# Patient Record
Sex: Male | Born: 1955 | Race: White | Hispanic: No | Marital: Married | Smoking: Never smoker
Health system: Southern US, Community
[De-identification: ages and names within clinical notes are randomized; demographics above are authoritative.]

## PROBLEM LIST (undated history)

## (undated) DIAGNOSIS — F419 Anxiety disorder, unspecified: Secondary | ICD-10-CM

## (undated) HISTORY — PX: HERNIA REPAIR: SHX51

## (undated) HISTORY — PX: TONSILLECTOMY: SUR1361

---

## 2010-08-28 DEATH — deceased

## 2010-11-03 DIAGNOSIS — N529 Male erectile dysfunction, unspecified: Secondary | ICD-10-CM | POA: Insufficient documentation

## 2010-11-03 DIAGNOSIS — I10 Essential (primary) hypertension: Secondary | ICD-10-CM | POA: Insufficient documentation

## 2010-11-04 DIAGNOSIS — E349 Endocrine disorder, unspecified: Secondary | ICD-10-CM | POA: Insufficient documentation

## 2011-07-27 ENCOUNTER — Emergency Department: Payer: Self-pay | Admitting: Unknown Physician Specialty

## 2011-07-27 ENCOUNTER — Ambulatory Visit: Payer: Self-pay | Admitting: Family Medicine

## 2011-08-07 DIAGNOSIS — M542 Cervicalgia: Secondary | ICD-10-CM | POA: Insufficient documentation

## 2013-04-04 ENCOUNTER — Ambulatory Visit: Payer: Self-pay | Admitting: Family Medicine
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
## 2014-05-21 ENCOUNTER — Encounter: Payer: Self-pay | Admitting: *Deleted

## 2014-05-21 ENCOUNTER — Encounter: Payer: Self-pay | Admitting: Podiatry

## 2014-05-21 ENCOUNTER — Other Ambulatory Visit: Payer: Self-pay | Admitting: *Deleted

## 2014-05-21 ENCOUNTER — Ambulatory Visit (INDEPENDENT_AMBULATORY_CARE_PROVIDER_SITE_OTHER): Payer: BLUE CROSS/BLUE SHIELD | Admitting: Podiatry

## 2014-05-21 ENCOUNTER — Ambulatory Visit (INDEPENDENT_AMBULATORY_CARE_PROVIDER_SITE_OTHER): Payer: BLUE CROSS/BLUE SHIELD

## 2014-05-21 VITALS — Ht 72.0 in | Wt 210.0 lb

## 2014-05-21 DIAGNOSIS — M205X9 Other deformities of toe(s) (acquired), unspecified foot: Secondary | ICD-10-CM | POA: Diagnosis not present

## 2014-05-21 DIAGNOSIS — J302 Other seasonal allergic rhinitis: Secondary | ICD-10-CM | POA: Insufficient documentation

## 2014-05-21 DIAGNOSIS — K219 Gastro-esophageal reflux disease without esophagitis: Secondary | ICD-10-CM | POA: Insufficient documentation

## 2014-05-21 DIAGNOSIS — L84 Corns and callosities: Secondary | ICD-10-CM

## 2014-05-21 DIAGNOSIS — M779 Enthesopathy, unspecified: Secondary | ICD-10-CM

## 2014-05-21 DIAGNOSIS — M1990 Unspecified osteoarthritis, unspecified site: Secondary | ICD-10-CM | POA: Insufficient documentation
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
## 2014-06-10 NOTE — Patient Instructions (Signed)

## 2014-06-16 ENCOUNTER — Ambulatory Visit: Payer: BLUE CROSS/BLUE SHIELD | Admitting: Podiatry

## 2014-06-16 ENCOUNTER — Encounter: Payer: BLUE CROSS/BLUE SHIELD | Admitting: Podiatry

## 2014-06-29 NOTE — Progress Notes (Signed)
error 

## 2015-02-16 ENCOUNTER — Ambulatory Visit
Admission: EM | Admit: 2015-02-16 | Discharge: 2015-02-16 | Disposition: A | Discharge status: Home / Self Care | Payer: BLUE CROSS/BLUE SHIELD | Attending: Family Medicine | Admitting: Family Medicine

## 2015-02-16 ENCOUNTER — Ambulatory Visit (INDEPENDENT_AMBULATORY_CARE_PROVIDER_SITE_OTHER): Payer: BLUE CROSS/BLUE SHIELD

## 2015-02-16 DIAGNOSIS — F419 Anxiety disorder, unspecified: Secondary | ICD-10-CM | POA: Diagnosis not present

## 2015-02-16 DIAGNOSIS — Z7982 Long term (current) use of aspirin: Secondary | ICD-10-CM | POA: Diagnosis not present

## 2015-02-16 DIAGNOSIS — R0789 Other chest pain: Secondary | ICD-10-CM

## 2015-02-16 HISTORY — DX: Anxiety disorder, unspecified: F41.9

## 2015-02-16 LAB — COMPREHENSIVE METABOLIC PANEL
ALBUMIN: 4.5 g/dL (ref 3.5–5.0)
ALT: 68 U/L — ABNORMAL HIGH (ref 17–63)
ANION GAP: 9 (ref 5–15)
AST: 31 U/L (ref 15–41)
Alkaline Phosphatase: 94 U/L (ref 38–126)
BUN: 18 mg/dL (ref 6–20)
CHLORIDE: 99 mmol/L — AB (ref 101–111)
CO2: 30 mmol/L (ref 22–32)
Calcium: 9.3 mg/dL (ref 8.9–10.3)
Creatinine, Ser: 1.03 mg/dL (ref 0.61–1.24)
GFR calc Af Amer: >60 mL/min (ref >60)
GFR calc non Af Amer: >60 mL/min (ref >60)
GLUCOSE: 108 mg/dL — AB (ref 65–99)
POTASSIUM: 4.8 mmol/L (ref 3.5–5.1)
Sodium: 138 mmol/L (ref 135–145)
Total Bilirubin: 1.1 mg/dL (ref 0.3–1.2)
Total Protein: 7.5 g/dL (ref 6.5–8.1)

## 2015-02-16 LAB — CBC WITH DIFFERENTIAL/PLATELET
BASOS ABS: 0.1 10*3/uL (ref 0–0.1)
BASOS PCT: 1 %
EOS ABS: 0.6 10*3/uL (ref 0–0.7)
EOS PCT: 8 %
HCT: 43.9 % (ref 40.0–52.0)
Hemoglobin: 14.9 g/dL (ref 13.0–18.0)
Lymphocytes Relative: 21 %
Lymphs Abs: 1.5 10*3/uL (ref 1.0–3.6)
MCH: 30.8 pg (ref 26.0–34.0)
MCHC: 33.8 g/dL (ref 32.0–36.0)
MCV: 91.2 fL (ref 80.0–100.0)
MONO ABS: 0.6 10*3/uL (ref 0.2–1.0)
Monocytes Relative: 8 %
Neutro Abs: 4.5 10*3/uL (ref 1.4–6.5)
Neutrophils Relative %: 62 %
PLATELETS: 415 10*3/uL (ref 150–440)
RBC: 4.82 MIL/uL (ref 4.40–5.90)
RDW: 13.4 % (ref 11.5–14.5)
WBC: 7.2 10*3/uL (ref 3.8–10.6)

## 2015-02-16 LAB — CKMB (ARMC ONLY): CK, MB: 1 ng/mL (ref 0.5–5.0)

## 2015-02-16 LAB — TROPONIN I

## 2015-02-16 LAB — CK: Total CK: 112 U/L (ref 49–397)

## 2015-02-16 NOTE — ED Notes (Signed)
Started 1 week ago with intermittent bouts of midsternal non radiating chest pain last about 1 minute. Happened at rest as well as when working out. Denies SOB and not diaphoretic. [facility] small episode [facility]. Color pink, skin warm and dry

## 2015-02-16 NOTE — ED Provider Notes (Signed)
CSN: [id number]     Arrival date & time 02/16/15  0913 History   First MD Initiated Contact with Patient 02/16/15 [phone]    Nurses notes were reviewed. Chief Complaint  Patient presents with  . Chest Pain   Patient is here for vague and atypical chest pain. He reports having an URI about a month ago after having dental surgery. He was placed on amoxicillin and he thought he got better. About a week ago he's having a vague chest pain last 20s to 30 minutes at a time. He has no some trouble when he started to exercise with the discomfort. He is also may be minimal component night with it as well. He states that he has googled this of course. So he is able to tell me the pain never radiates and he doesn't have any jaw or neck pain. He does wake up morning with some halitosis and dry mouth but this is something this occurring on almost daily basis and is not related to the chest pain has. He denies a total cholesterol states that his triglycerides were just borderline but cholesterol was fine we had checked about 3 or 4 months ago. He had an EKG before but doesn't know where when or the results of. States his father died when he was 74 and never had a history of heart disease. He smoked in his 39s but hasn't smoked in over 20 years     (Consider location/radiation/quality/duration/timing/severity/associated sxs/prior Treatment) Patient is a 59 y.o. male presenting with chest pain. The history is provided by the patient. No language interpreter was used.  Chest Pain Pain location:  Substernal area Pain quality: aching, dull and sharp   Pain quality: not burning, not crushing, no pressure, not radiating, not shooting, not stabbing, not tearing and not throbbing   Pain radiates to:  Does not radiate Pain radiates to the back: no   Pain severity:  Mild Onset quality:  Sudden Duration:  20 minutes Timing:  Sporadic Progression:  Waxing and waning Chronicity:  New Context: movement and at rest    Context: not breathing, no drug use, not eating, not lifting and no stress   Relieved by:  Nothing Worsened by:  Movement Ineffective treatments:  None tried Associated symptoms: AICD problem and anxiety   Associated symptoms: no abdominal pain, no anorexia, no back pain, no fatigue, no lower extremity edema, no shortness of breath and no syncope   Risk factors: male sex     Past Medical History  Diagnosis Date  . Anxiety    Past Surgical History  Procedure Laterality Date  . Hernia repair    . Tonsillectomy     History reviewed. No pertinent family history. Social History  Substance Use Topics  . Smoking status: Never Smoker   . Smokeless tobacco: None  . Alcohol Use: No    Review of Systems  Constitutional: Negative for fatigue.  Respiratory: Negative for shortness of breath.   Cardiovascular: Positive for chest pain. Negative for syncope.  Gastrointestinal: Negative for abdominal pain and anorexia.  Musculoskeletal: Negative for back pain.  All other systems reviewed and are negative.   Allergies  Review of patient's allergies indicates no known allergies.  Home Medications   Prior to Admission medications   Medication Sig Start Date End Date Taking? Authorizing Provider  Aspirin (ASPIR-81 PO) Take by mouth. 06/11/09   Historical Provider, MD  Misc Natural Products (GLUCOSAMINE-CHONDROITIN DS) TABS Take by mouth. 11/04/10   Historical Provider, MD  Multiple Vitamins-Minerals (MULTIVITAMIN & MINERAL PO) Take by mouth. 06/11/09   Historical Provider, MD  Omega-3 Fatty Acids (FISH OIL) 1000 MG CAPS Take by mouth. 06/11/09   Historical Provider, MD  phytonadione (VITAMIN K) 2 MG/ML SOLN oral solution Take by mouth. 03/23/10   Historical Provider, MD  Zinc Acetate, Oral, (ZINC ACETATE PO) Take by mouth. 03/23/10   Historical Provider, MD   Meds Ordered and Administered this Visit  Medications - No data to display  BP 153/91 mmHg  Pulse 65  Temp(Src) 97.5 F (36.4 C)  (Tympanic)  Resp 17  Ht 6' (1.829 m)  Wt 205 lb (92.987 kg)  BMI 27.80 kg/m2  SpO2 100% No data found.   Physical Exam  Constitutional: He is oriented to person, place, and time. He appears well-developed and well-nourished.  HENT:  Head: Normocephalic and atraumatic.  Eyes: Pupils are equal, round, and reactive to light.  Neck: Normal range of motion. No tracheal deviation present.  Cardiovascular: Normal rate, regular rhythm and normal heart sounds.   Pulmonary/Chest: Effort normal and breath sounds normal. No respiratory distress.  Abdominal: Soft.  Musculoskeletal: Normal range of motion.  Neurological: He is alert and oriented to person, place, and time. No cranial nerve deficit.  Skin: Skin is warm and dry. No erythema.  Vitals reviewed.   ED Course  Procedures (including critical care time)  Labs Review Labs Reviewed  COMPREHENSIVE METABOLIC PANEL - Abnormal; Notable for the following:    Chloride 99 (*)    Glucose, Bld 108 (*)    ALT 68 (*)    All other components within normal limits  CBC WITH DIFFERENTIAL/PLATELET  TROPONIN I  CK  CKMB([facility] ONLY)    Imaging Review Dg Chest 2 View  02/16/2015  CLINICAL DATA:  Patient with mid sternal chest pain for 1 week. EXAM: CHEST  2 VIEW COMPARISON:  None. FINDINGS: Normal cardiac and mediastinal contours. Mild tortuosity of the thoracic aorta. No consolidative pulmonary opacities. No pleural effusion or pneumothorax. Mid thoracic spine degenerative changes. IMPRESSION: No acute cardiopulmonary process. Electronically Signed   By: Annia Beltrew  Davis M.D.   On: 02/16/2015 10:35     Visual Acuity Review  Right Eye Distance:   Left Eye Distance:   Bilateral Distance:    Right Eye Near:   Left Eye Near:    Bilateral Near:     Results for orders placed or performed during the hospital encounter of 02/16/15  CBC with Differential  Result Value Ref Range   WBC 7.2 3.8 - 10.6 K/uL   RBC 4.82 4.40 - 5.90 MIL/uL    Hemoglobin 14.9 13.0 - 18.0 g/dL   HCT 56.243.9 13.040.0 - 86.552.0 %   MCV 91.2 80.0 - 100.0 fL   MCH 30.8 26.0 - 34.0 pg   MCHC 33.8 32.0 - 36.0 g/dL   RDW 78.413.4 69.611.5 - 29.514.5 %   Platelets 415 150 - 440 K/uL   Neutrophils Relative % 62 %   Neutro Abs 4.5 1.4 - 6.5 K/uL   Lymphocytes Relative 21 %   Lymphs Abs 1.5 1.0 - 3.6 K/uL   Monocytes Relative 8 %   Monocytes Absolute 0.6 0.2 - 1.0 K/uL   Eosinophils Relative 8 %   Eosinophils Absolute 0.6 0 - 0.7 K/uL   Basophils Relative 1 %   Basophils Absolute 0.1 0 - 0.1 K/uL  Comprehensive metabolic panel  Result Value Ref Range   Sodium 138 135 - 145 mmol/L   Potassium 4.8 3.5 -  5.1 mmol/L   Chloride 99 (L) 101 - 111 mmol/L   CO2 30 22 - 32 mmol/L   Glucose, Bld 108 (H) 65 - 99 mg/dL   BUN 18 6 - 20 mg/dL   Creatinine, Ser 1.61 0.61 - 1.24 mg/dL   Calcium 9.3 8.9 - 09.6 mg/dL   Total Protein 7.5 6.5 - 8.1 g/dL   Albumin 4.5 3.5 - 5.0 g/dL   AST 31 15 - 41 U/L   ALT 68 (H) 17 - 63 U/L   Alkaline Phosphatase 94 38 - 126 U/L   Total Bilirubin 1.1 0.3 - 1.2 mg/dL   GFR calc non Af Amer >60 >60 mL/min   GFR calc Af Amer >60 >60 mL/min   Anion gap 9 5 - 15  Troponin I  Result Value Ref Range   Troponin I <0.03 <0.031 ng/mL  CK  Result Value Ref Range   Total CK 112 49 - 397 U/L  CKMB([facility] only)  Result Value Ref Range   CK, MB 1.0 0.5 - 5.0 ng/mL   ED ECG REPORT   Date: 02/16/2015  EKG Time: 11:42 AM  Rate: 64  Rhythm: normal sinus rhythm,  there are no previous tracings available for comparison, normal sinus rhythm, questionable septal infarct  Axis: 29  Intervals:none  ST&T Change: None  Narrative Interpretation: Patient has a normal sinus rhythm but nonspecific findings that could indicate a septal infarct but no EKG to compare with no acute findings were seen.          MDM   1. Atypical chest pain     Recommend referral to cardiologist for evaluation and possible stress test and echo. Those tests now pending as well as  referral. Patient was instructed with not giving him reassurance that there is been no heart damage or that he does not have a heart problem when telling him is that I do not find anything acute going on right now. If his chest pain gets worse if he continues to have chest pain strongly suggest he go to the ED of his choice. Work note will be given for today and tomorrow as well and I did suggest she start taking a baby aspirin daily. Request copy of his lab work and EKG and chest x-ray and will do that.   Hassan Rowan, MD 02/16/15 [phone]

## 2015-02-16 NOTE — Discharge Instructions (Signed)

## 2016-07-14 ENCOUNTER — Ambulatory Visit (INDEPENDENT_AMBULATORY_CARE_PROVIDER_SITE_OTHER): Payer: BLUE CROSS/BLUE SHIELD | Admitting: Podiatry

## 2016-07-14 DIAGNOSIS — M722 Plantar fascial fibromatosis: Secondary | ICD-10-CM | POA: Diagnosis not present

## 2016-07-14 MED ORDER — MELOXICAM 15 MG PO TABS: 15.0000 mg | ORAL_TABLET | Freq: Every day | ORAL | 1 refills | Status: DC

## 2016-07-15 NOTE — Progress Notes (Signed)
   Subjective: Patient with PMHx of left plantar fasciitis presents today for pain and tenderness in the left foot that began about five months ago. He states he believes he is having an exacerbation of his plantar fasciitis. Patient states that it hurts in the mornings with the first steps out of bed. He is requesting orthotics. Patient presents today for further treatment and evaluation  Objective: Physical Exam General: The patient is alert and oriented x3 in no acute distress.  Dermatology: Skin is warm, dry and supple bilateral lower extremities. Negative for open lesions or macerations bilateral.   Vascular: Dorsalis Pedis and Posterior Tibial pulses palpable bilateral.  Capillary fill time is immediate to all digits.  Neurological: Epicritic and protective threshold intact bilateral.   Musculoskeletal: Tenderness to palpation at the medial calcaneal tubercale and through the insertion of the plantar fascia of the left foot. All other joints range of motion within normal limits bilateral. Strength 5/5 in all groups bilateral.    Assessment: 1. Plantar fasciitis left foot 2. Pain in left foot  Plan of Care:   1. Patient evaluated. 2. Injection of 0.5cc Celestone soluspan injected into the left plantar fascia.  3. Instructed patient regarding therapies and modalities at home to alleviate symptoms.  4. Rx for meloxicam 15mg  PO given to patient.  5. Pt scanned for custom molded orthotics. 6. Plantar fascial band(s) dispensed. 7. Return to clinic in 4 weeks.     Felecia ShellingBrent M. Evans, DPM [facility]  Dr. Felecia ShellingBrent M. Evans, DPM    [address]                                        [place], [place] [address]                Office [phone]  Fax [phone]

## 2016-07-18 MED ORDER — BETAMETHASONE SOD PHOS & ACET 6 (3-3) MG/ML IJ SUSP: 3.0000 mg | Freq: Once | INTRAMUSCULAR | Status: AC

## 2016-08-15 ENCOUNTER — Ambulatory Visit (INDEPENDENT_AMBULATORY_CARE_PROVIDER_SITE_OTHER): Payer: BLUE CROSS/BLUE SHIELD | Admitting: Podiatry

## 2016-08-15 DIAGNOSIS — M722 Plantar fascial fibromatosis: Secondary | ICD-10-CM

## 2016-08-15 MED ORDER — MELOXICAM 15 MG PO TABS: 15.0000 mg | ORAL_TABLET | Freq: Every day | ORAL | 1 refills | Status: AC

## 2016-08-15 NOTE — Progress Notes (Signed)
Patient ID: Aurora MaskStephen M Schwarting, male   DOB: 1956/02/12, 61 y.o.   MRN: [id number] Patient presents for orthotic pick up.  Verbal and written break in and wear instructions given.  Patient will follow up in 4 weeks if symptoms worsen or fail to improve.

## 2016-08-15 NOTE — Patient Instructions (Signed)

## 2016-08-19 NOTE — Progress Notes (Signed)
   Subjective: Patient presents today for follow-up evaluation of plantar fasciitis of the left foot. She states her pain had improved for some time but has now returned in the posterior left foot. She states the injection helped her for 3-4 days. She denies any new trauma. Patient presents today for further treatment and evaluation  Objective: Physical Exam General: The patient is alert and oriented x3 in no acute distress.  Dermatology: Skin is warm, dry and supple bilateral lower extremities. Negative for open lesions or macerations bilateral.   Vascular: Dorsalis Pedis and Posterior Tibial pulses palpable bilateral.  Capillary fill time is immediate to all digits.  Neurological: Epicritic and protective threshold intact bilateral.   Musculoskeletal: Tenderness to palpation at the medial calcaneal tubercale and through the insertion of the plantar fascia of the left foot. All other joints range of motion within normal limits bilateral. Strength 5/5 in all groups bilateral.    Assessment: 1. Plantar fasciitis left foot 2. Pain in left foot  Plan of Care:   1. Patient evaluated. 2. Injection of 0.5cc Celestone soluspan injected into the left plantar fascia.  3. Continue meloxicam 15 mg. 4. Orthotics dispensed today. 5. Return to clinic in 4 weeks.    Felecia ShellingBrent M. Katalyna Socarras, DPM [facility]  Dr. Felecia ShellingBrent M. Monzerrath Mcburney, DPM    [address]                                        [place], [place] [address]                Office [phone]  Fax [phone]

## 2016-08-21 MED ORDER — BETAMETHASONE SOD PHOS & ACET 6 (3-3) MG/ML IJ SUSP: 3.0000 mg | Freq: Once | INTRAMUSCULAR | Status: AC

## 2016-09-12 ENCOUNTER — Ambulatory Visit: Payer: BLUE CROSS/BLUE SHIELD | Admitting: Podiatry

## 2016-09-15 ENCOUNTER — Ambulatory Visit (INDEPENDENT_AMBULATORY_CARE_PROVIDER_SITE_OTHER): Payer: BLUE CROSS/BLUE SHIELD | Admitting: Podiatry

## 2016-09-15 DIAGNOSIS — M722 Plantar fascial fibromatosis: Secondary | ICD-10-CM

## 2016-09-23 NOTE — Progress Notes (Signed)
   Subjective: Patient presents today for follow-up treatment and evaluation of plantar fasciitis to the left foot. He believes that the left foot orthotic is not working. He still is having pain in his left foot. He believes that the new insole is aggravating his symptoms  Objective: Physical Exam General: The patient is alert and oriented x3 in no acute distress.  Dermatology: Skin is warm, dry and supple bilateral lower extremities. Negative for open lesions or macerations bilateral.   Vascular: Dorsalis Pedis and Posterior Tibial pulses palpable bilateral.  Capillary fill time is immediate to all digits.  Neurological: Epicritic and protective threshold intact bilateral.   Musculoskeletal: Tenderness to palpation at the medial calcaneal tubercale and through the insertion of the plantar fascia of the left foot. All other joints range of motion within normal limits bilateral. Strength 5/5 in all groups bilateral.    Assessment: 1. Plantar fasciitis left foot 2. Pain in left foot  Plan of Care:   1. Patient evaluated. 2. Today we'll arrange an appointment with Raiford Nobleick, Pedorthist to modify orthotics. It appears as though the left orthotic does not appear to be contouring the arch of his foot appropriately. 3. Return to clinic when necessary. Follow-up with Raiford Nobleick, Pedorthist   Felecia ShellingBrent M. Mahamed Zalewski, DPM [facility]  Dr. Felecia ShellingBrent M. Osha Errico, DPM    [address]                                        [place], [place] [address]                Office [phone]  Fax [phone]

## 2016-09-27 ENCOUNTER — Ambulatory Visit: Payer: BLUE CROSS/BLUE SHIELD | Admitting: Orthotics

## 2016-09-27 DIAGNOSIS — M722 Plantar fascial fibromatosis: Secondary | ICD-10-CM

## 2016-09-27 NOTE — Progress Notes (Signed)
Patient came in to have [employer] f/o adjusted.  Patient complains that LEFT foot hurts along lateral aspect after wearing.    I recast him and will send to [employer] to adjust.  Hug Arch and 4* valgus wedge ff and 4* varus wedge RF..Marland Kitchen

## 2016-10-04 ENCOUNTER — Encounter: Payer: Self-pay | Admitting: *Deleted

## 2016-10-18 ENCOUNTER — Encounter: Payer: BLUE CROSS/BLUE SHIELD | Admitting: Orthotics

## 2016-10-25 ENCOUNTER — Encounter: Payer: BLUE CROSS/BLUE SHIELD | Admitting: Orthotics

## 2016-10-26 IMAGING — CR DG CHEST 2V
3 series · 3 of 3 positions shown · non-contrast
Comparison: None.

CLINICAL DATA: Patient with mid sternal chest pain for 1 week.

EXAM:
CHEST  2 VIEW

[chest pa]
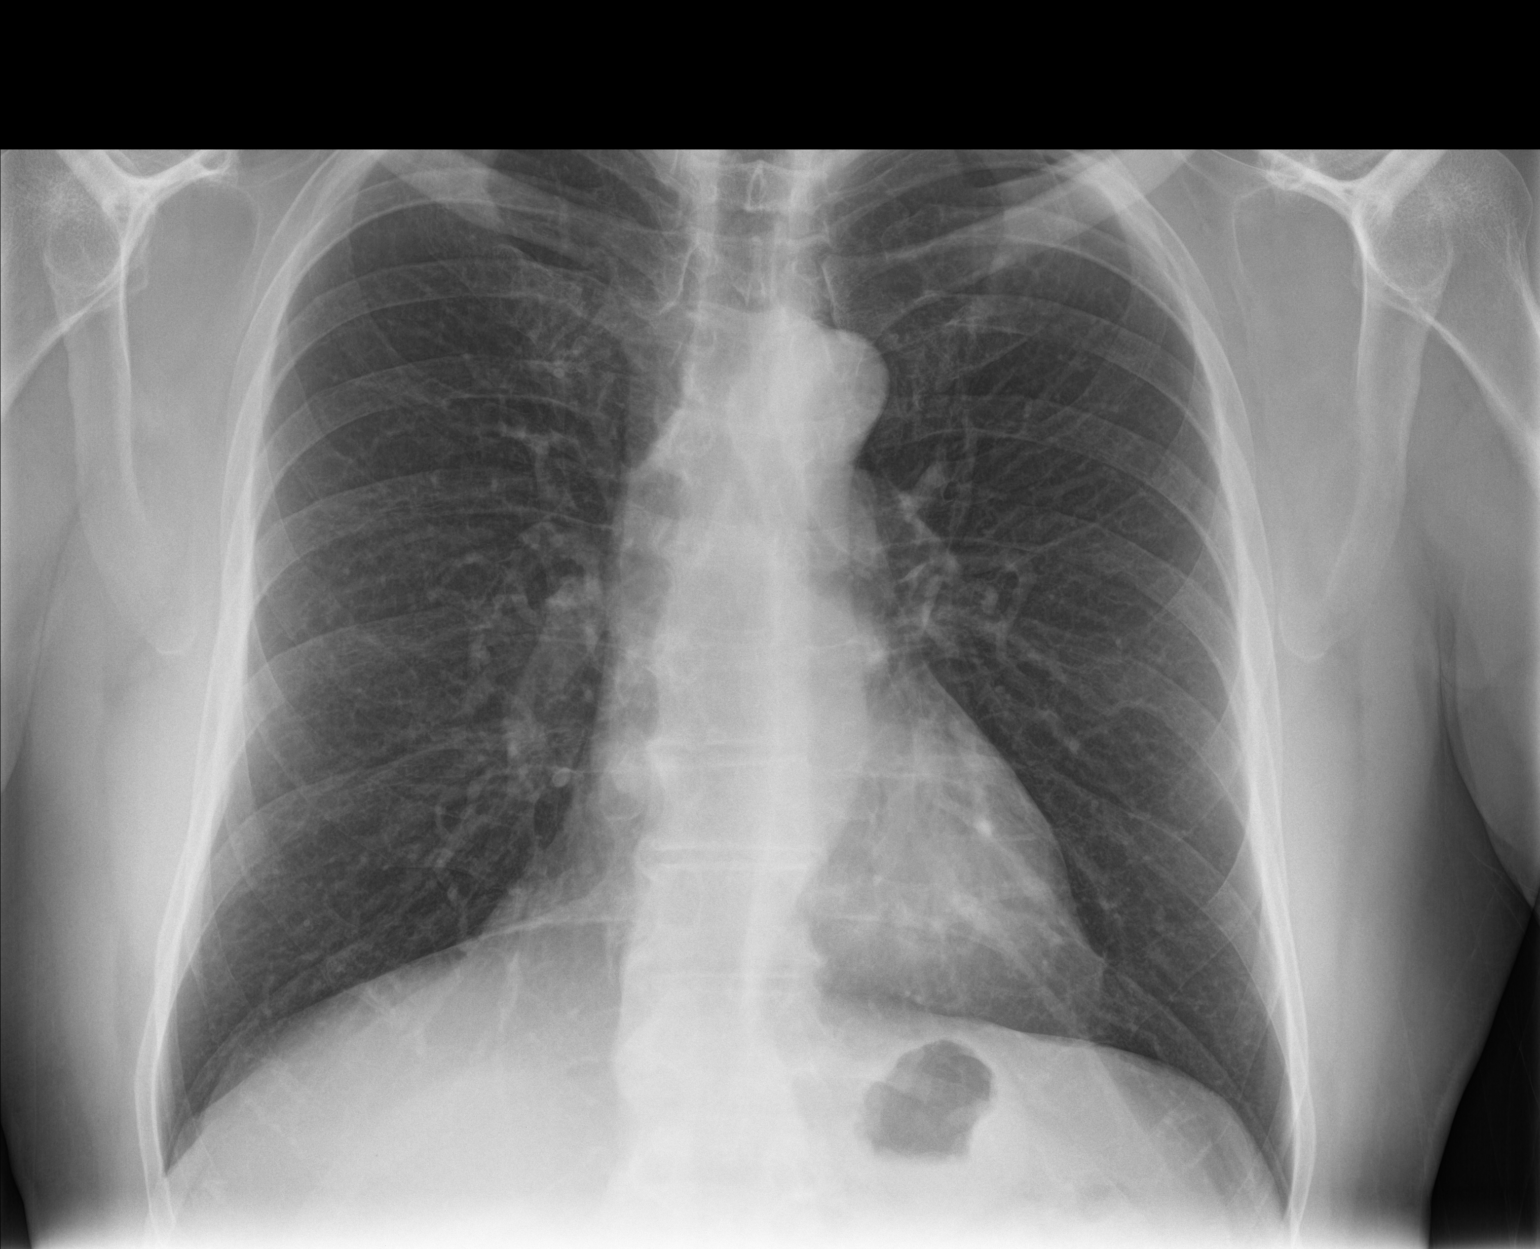

[chest lat (1 of 2)]
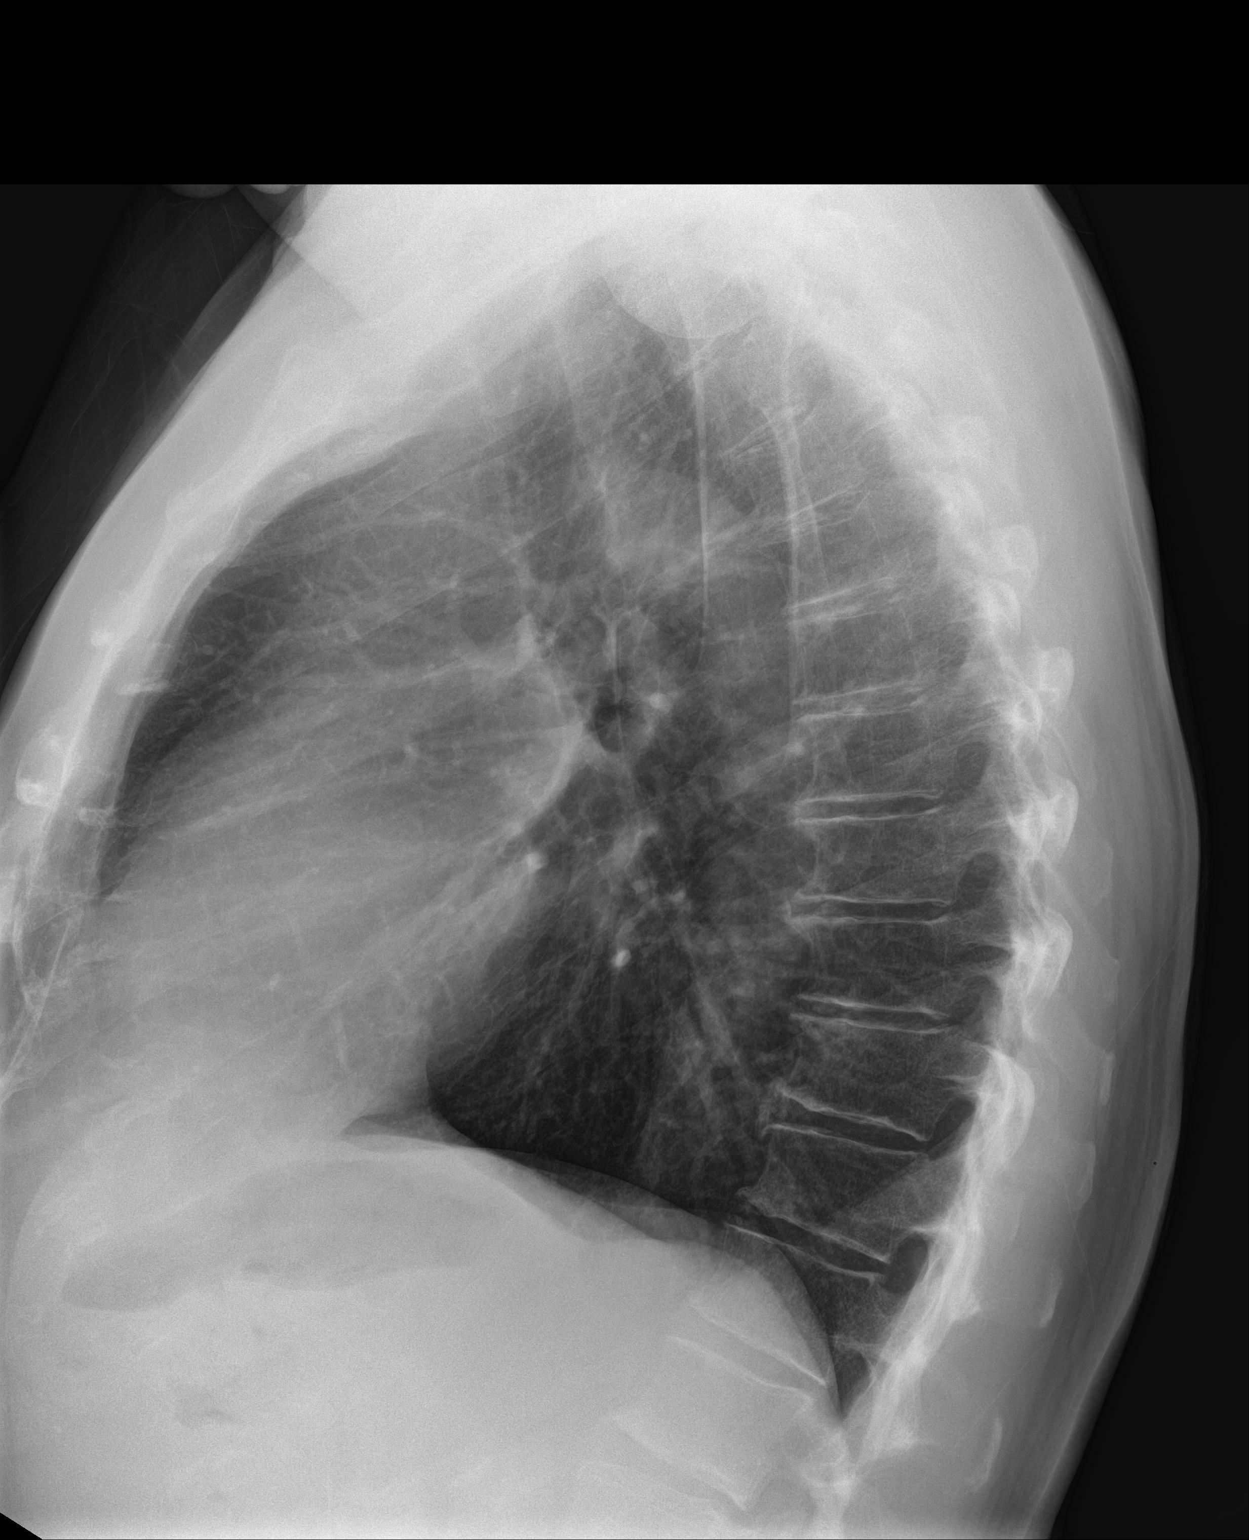

[chest lat (2 of 2)]
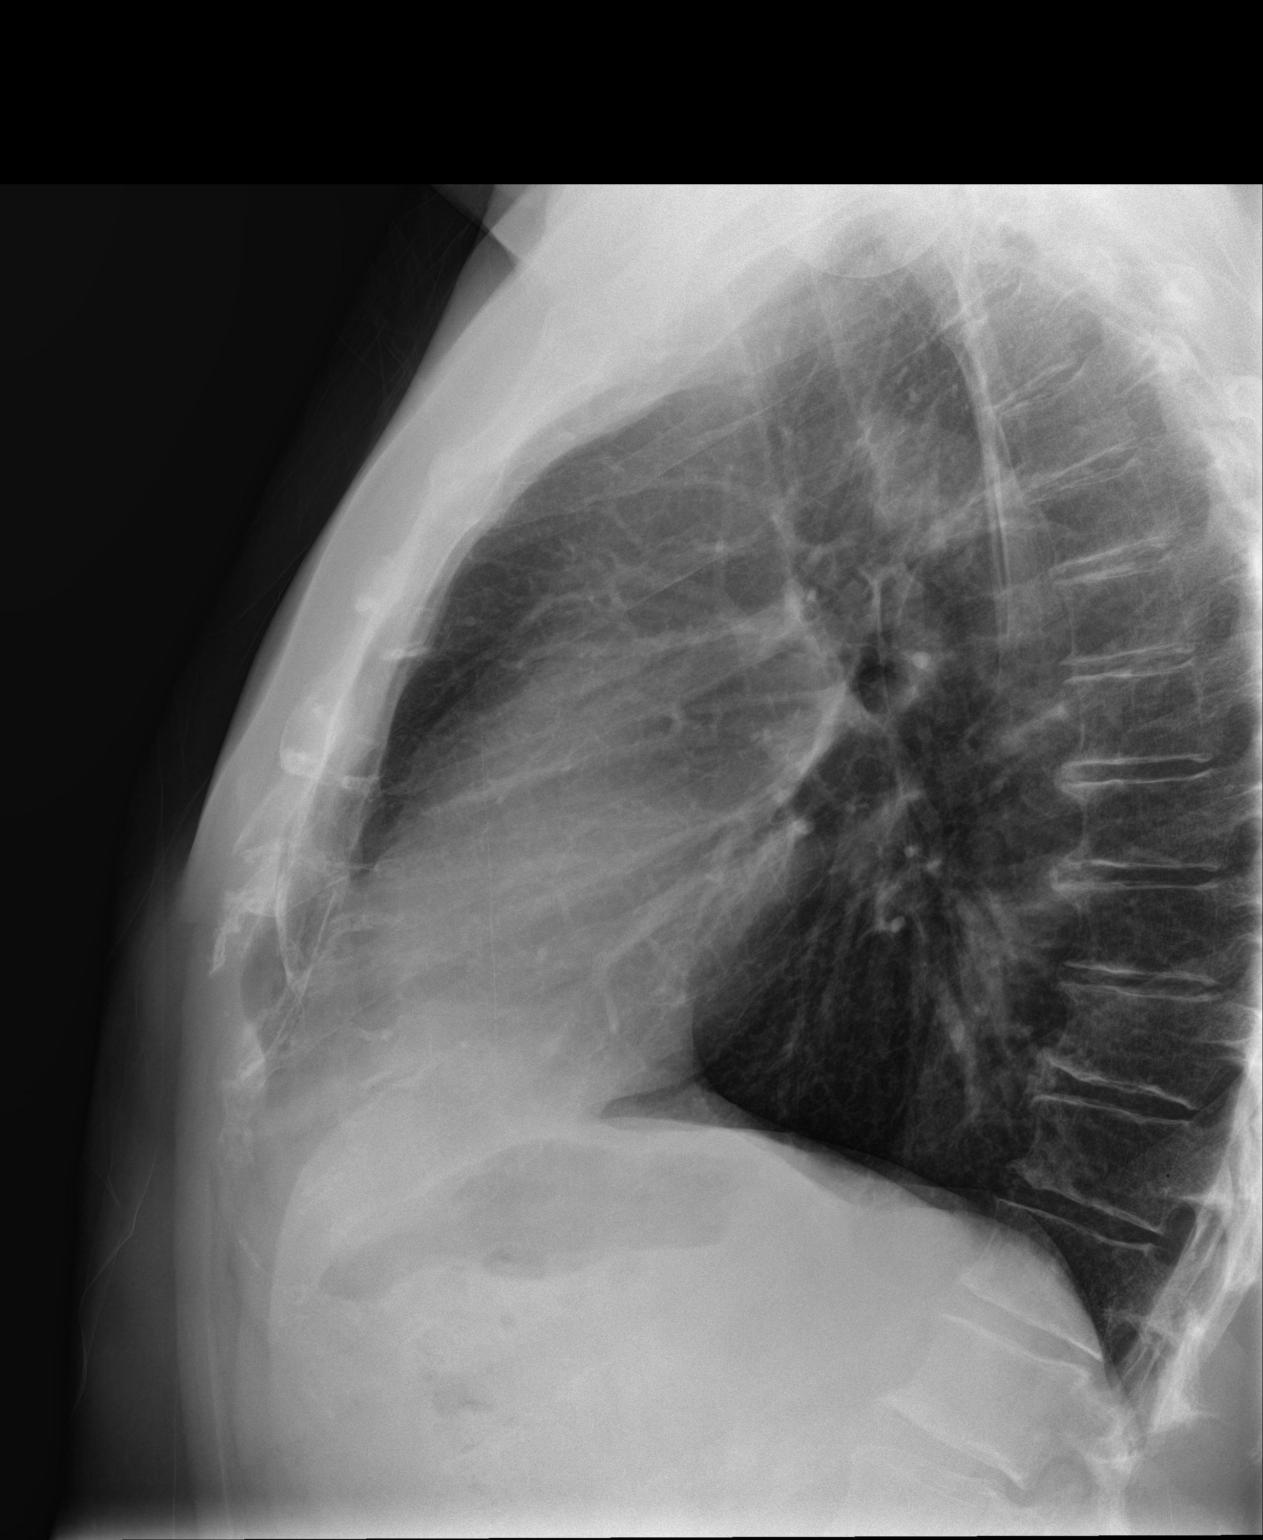

[3 of 3 positions shown; findings below may reference images not displayed]

FINDINGS: Normal cardiac and mediastinal contours. Mild tortuosity of the
thoracic aorta. No consolidative pulmonary opacities. No pleural
effusion or pneumothorax. Mid thoracic spine degenerative changes.
IMPRESSION: No acute cardiopulmonary process.

## 2016-11-01 ENCOUNTER — Encounter: Payer: BLUE CROSS/BLUE SHIELD | Admitting: Orthotics
